# Patient Record
Sex: Female | Born: 1995 | Race: Black or African American | Hispanic: No | Marital: Single | Smoking: Never smoker
Health system: Southern US, Community
[De-identification: ages and names within clinical notes are randomized; demographics above are authoritative.]

---
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
## 2017-07-13 IMAGING — CR DG CHEST 2V
2 series · 2 of 2 positions shown · non-contrast
Comparison: None.

CLINICAL DATA: Cough.  Pain in the lower chest.

EXAM:
CHEST  2 VIEW

[chest pa]
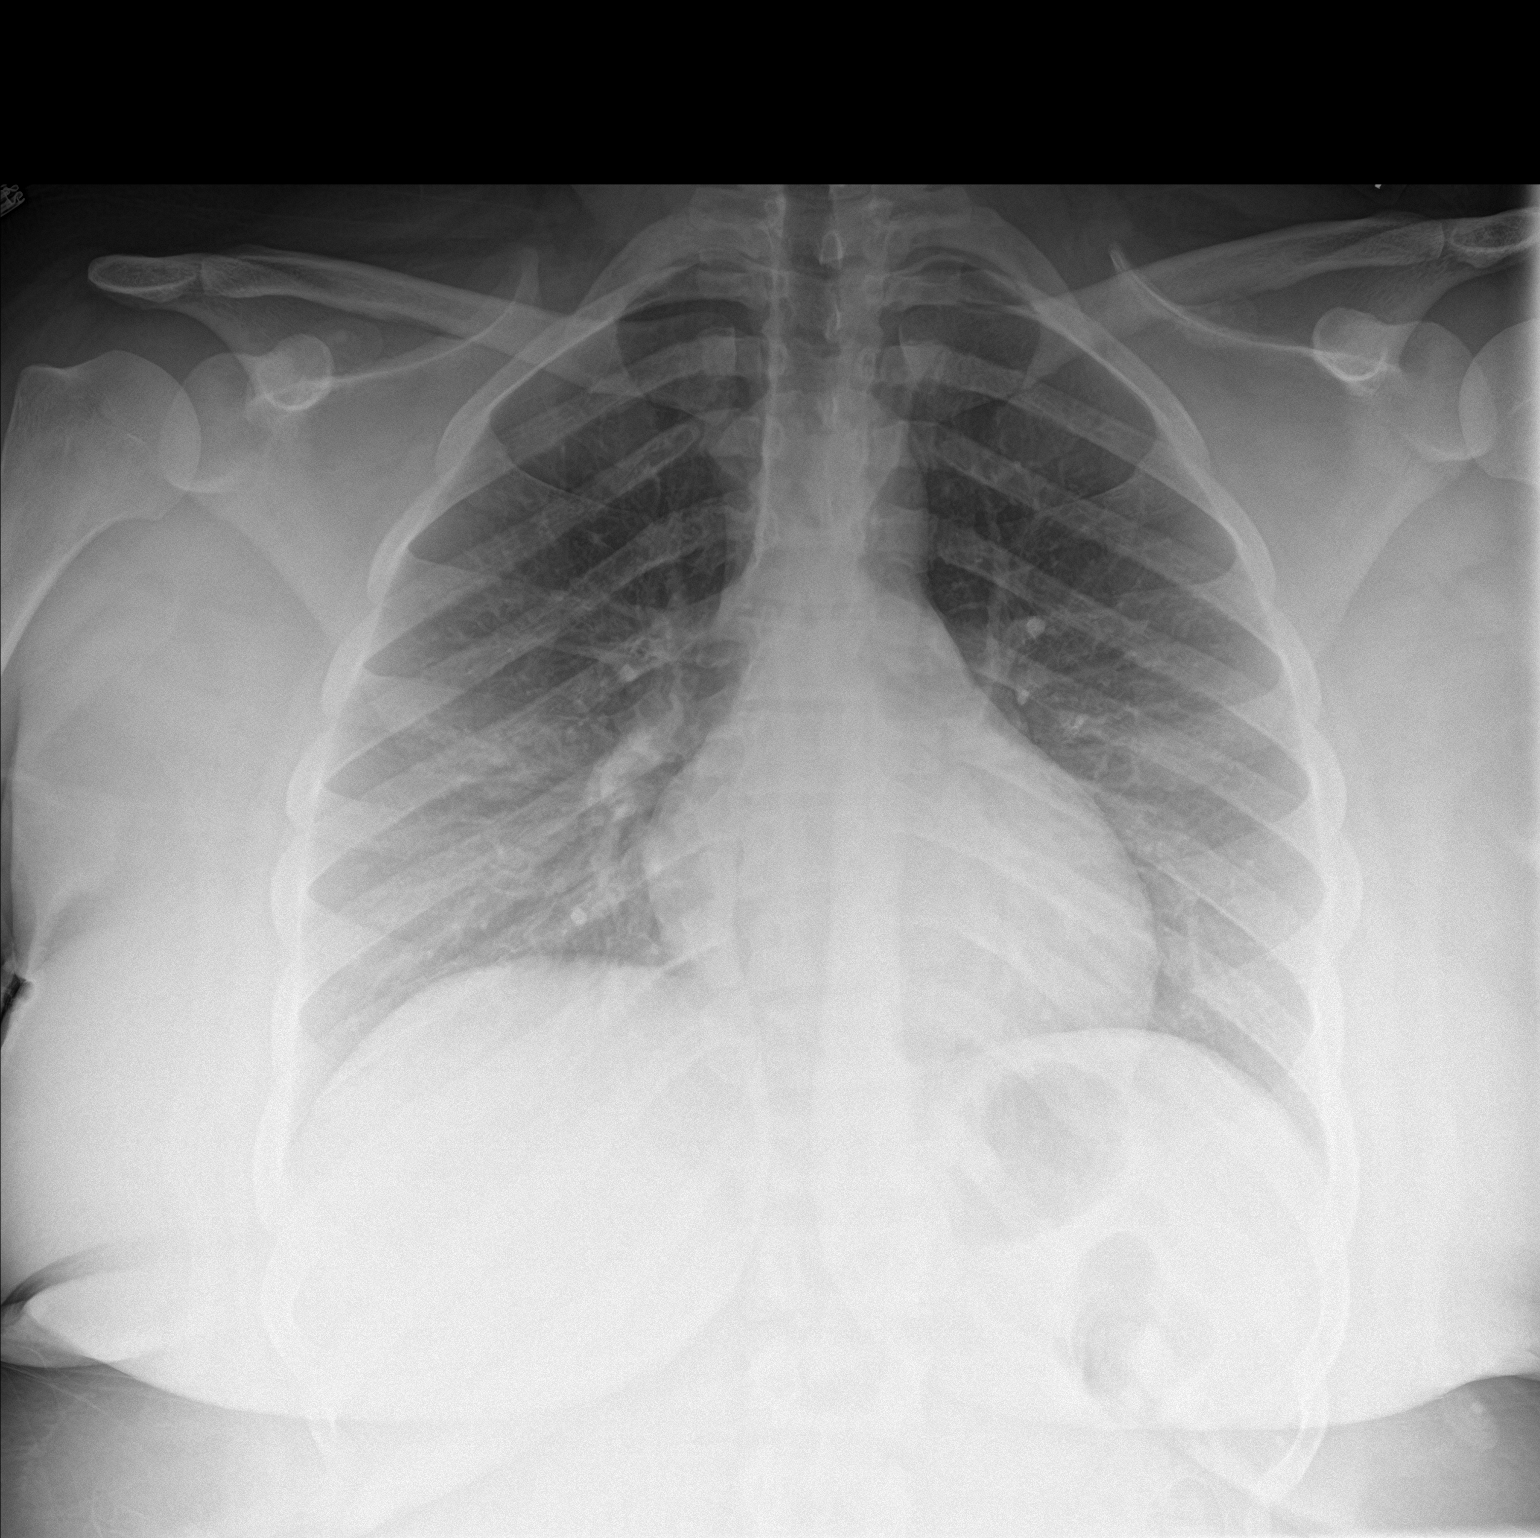

[chest lat]
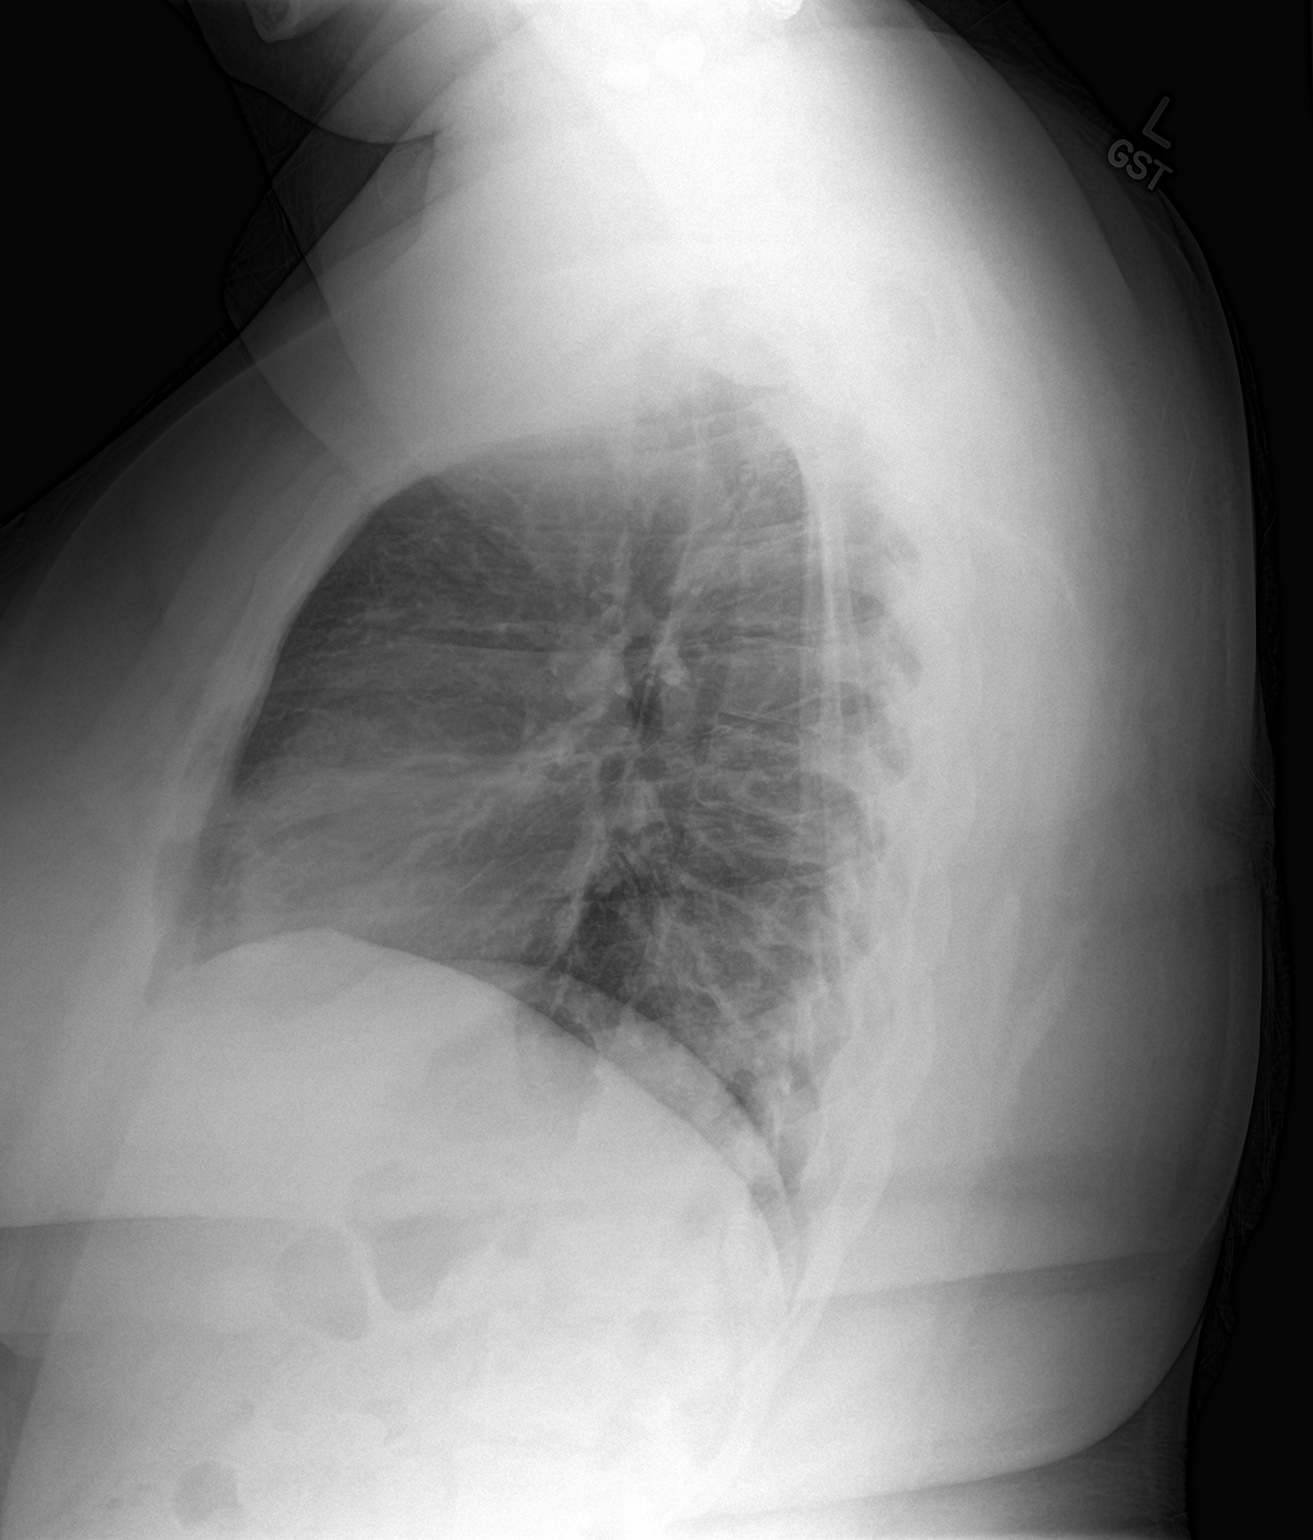

[2 of 2 positions shown; findings below may reference images not displayed]

FINDINGS: Normal heart size and mediastinal contours. No acute infiltrate or
edema. No effusion or pneumothorax. No acute osseous findings.
IMPRESSION: No active cardiopulmonary disease.
